# Patient Record
Sex: Female | Born: 1965 | Race: White | Hispanic: No | Marital: Married
Health system: Southern US, Community
[De-identification: ages and names within clinical notes are randomized; demographics above are authoritative.]

---

## 2004-02-12 ENCOUNTER — Other Ambulatory Visit: Admission: RE | Admit: 2004-02-12 | Discharge: 2004-02-12 | Payer: Self-pay | Admitting: Family Medicine

## 2004-05-22 ENCOUNTER — Other Ambulatory Visit: Admission: RE | Admit: 2004-05-22 | Discharge: 2004-05-22 | Payer: Self-pay | Admitting: Family Medicine

## 2005-12-30 ENCOUNTER — Other Ambulatory Visit: Admission: RE | Admit: 2005-12-30 | Discharge: 2005-12-30 | Payer: Self-pay | Admitting: Family Medicine

## 2006-01-08 ENCOUNTER — Encounter: Admission: RE | Admit: 2006-01-08 | Discharge: 2006-01-08 | Payer: Self-pay | Admitting: Family Medicine

## 2006-08-24 ENCOUNTER — Encounter: Admission: RE | Admit: 2006-08-24 | Discharge: 2006-08-24 | Payer: Self-pay | Admitting: Family Medicine

## 2007-03-15 ENCOUNTER — Encounter: Admission: RE | Admit: 2007-03-15 | Discharge: 2007-03-15 | Payer: Self-pay | Admitting: Family Medicine

## 2007-05-05 ENCOUNTER — Other Ambulatory Visit: Admission: RE | Admit: 2007-05-05 | Discharge: 2007-05-05 | Payer: Self-pay | Admitting: Family Medicine

## 2011-04-08 ENCOUNTER — Other Ambulatory Visit (HOSPITAL_COMMUNITY)
Admission: RE | Admit: 2011-04-08 | Discharge: 2011-04-08 | Disposition: A | Discharge status: Home / Self Care | Payer: BC Managed Care – PPO | Source: Ambulatory Visit | Attending: Family Medicine | Admitting: Family Medicine

## 2011-04-08 ENCOUNTER — Other Ambulatory Visit: Payer: Self-pay | Admitting: Family Medicine

## 2011-04-08 DIAGNOSIS — Z1159 Encounter for screening for other viral diseases: Secondary | ICD-10-CM | POA: Insufficient documentation

## 2011-04-08 DIAGNOSIS — Z124 Encounter for screening for malignant neoplasm of cervix: Secondary | ICD-10-CM | POA: Insufficient documentation

## 2012-06-14 ENCOUNTER — Other Ambulatory Visit: Payer: Self-pay | Admitting: Family Medicine

## 2012-06-14 DIAGNOSIS — Z1231 Encounter for screening mammogram for malignant neoplasm of breast: Secondary | ICD-10-CM

## 2012-07-08 ENCOUNTER — Ambulatory Visit
Admission: RE | Admit: 2012-07-08 | Discharge: 2012-07-08 | Disposition: A | Discharge status: Home / Self Care | Payer: BC Managed Care – PPO | Source: Ambulatory Visit | Attending: Family Medicine | Admitting: Family Medicine

## 2012-07-08 DIAGNOSIS — Z1231 Encounter for screening mammogram for malignant neoplasm of breast: Secondary | ICD-10-CM

## 2013-06-20 ENCOUNTER — Other Ambulatory Visit: Payer: Self-pay

## 2013-06-20 DIAGNOSIS — Z1231 Encounter for screening mammogram for malignant neoplasm of breast: Secondary | ICD-10-CM

## 2013-07-10 ENCOUNTER — Ambulatory Visit
Admission: RE | Admit: 2013-07-10 | Discharge: 2013-07-10 | Disposition: A | Discharge status: Home / Self Care | Payer: BC Managed Care – PPO | Source: Ambulatory Visit

## 2013-07-10 DIAGNOSIS — Z1231 Encounter for screening mammogram for malignant neoplasm of breast: Secondary | ICD-10-CM

## 2013-07-13 ENCOUNTER — Other Ambulatory Visit: Payer: Self-pay | Admitting: Family Medicine

## 2013-07-13 DIAGNOSIS — R928 Other abnormal and inconclusive findings on diagnostic imaging of breast: Secondary | ICD-10-CM

## 2013-07-31 ENCOUNTER — Ambulatory Visit
Admission: RE | Admit: 2013-07-31 | Discharge: 2013-07-31 | Disposition: A | Discharge status: Home / Self Care | Payer: BC Managed Care – PPO | Source: Ambulatory Visit | Attending: Family Medicine | Admitting: Family Medicine

## 2013-07-31 DIAGNOSIS — R928 Other abnormal and inconclusive findings on diagnostic imaging of breast: Secondary | ICD-10-CM

## 2014-01-02 ENCOUNTER — Other Ambulatory Visit: Payer: Self-pay | Admitting: Family Medicine

## 2014-01-02 DIAGNOSIS — R921 Mammographic calcification found on diagnostic imaging of breast: Secondary | ICD-10-CM

## 2014-01-31 ENCOUNTER — Ambulatory Visit
Admission: RE | Admit: 2014-01-31 | Discharge: 2014-01-31 | Disposition: A | Discharge status: Home / Self Care | Payer: BC Managed Care – PPO | Source: Ambulatory Visit | Attending: Family Medicine | Admitting: Family Medicine

## 2014-01-31 ENCOUNTER — Encounter (INDEPENDENT_AMBULATORY_CARE_PROVIDER_SITE_OTHER): Payer: Self-pay

## 2014-01-31 DIAGNOSIS — R921 Mammographic calcification found on diagnostic imaging of breast: Secondary | ICD-10-CM

## 2014-07-31 ENCOUNTER — Other Ambulatory Visit: Payer: Self-pay | Admitting: Family Medicine

## 2014-07-31 DIAGNOSIS — R921 Mammographic calcification found on diagnostic imaging of breast: Secondary | ICD-10-CM

## 2014-08-15 ENCOUNTER — Ambulatory Visit
Admission: RE | Admit: 2014-08-15 | Discharge: 2014-08-15 | Disposition: A | Discharge status: Home / Self Care | Payer: BC Managed Care – PPO | Source: Ambulatory Visit | Attending: Family Medicine | Admitting: Family Medicine

## 2014-08-15 DIAGNOSIS — R921 Mammographic calcification found on diagnostic imaging of breast: Secondary | ICD-10-CM

## 2014-10-09 ENCOUNTER — Other Ambulatory Visit (HOSPITAL_COMMUNITY)
Admission: RE | Admit: 2014-10-09 | Discharge: 2014-10-09 | Disposition: A | Discharge status: Home / Self Care | Payer: BC Managed Care – PPO | Source: Ambulatory Visit | Attending: Family Medicine | Admitting: Family Medicine

## 2014-10-09 ENCOUNTER — Other Ambulatory Visit: Payer: Self-pay | Admitting: Family Medicine

## 2014-10-09 DIAGNOSIS — Z124 Encounter for screening for malignant neoplasm of cervix: Secondary | ICD-10-CM | POA: Insufficient documentation

## 2014-10-09 DIAGNOSIS — Z1151 Encounter for screening for human papillomavirus (HPV): Secondary | ICD-10-CM | POA: Insufficient documentation

## 2014-10-12 LAB — CYTOLOGY - PAP

## 2014-10-24 ENCOUNTER — Other Ambulatory Visit: Payer: Self-pay | Admitting: Obstetrics & Gynecology

## 2015-08-27 ENCOUNTER — Other Ambulatory Visit: Payer: Self-pay | Admitting: Family Medicine

## 2015-08-27 DIAGNOSIS — R921 Mammographic calcification found on diagnostic imaging of breast: Secondary | ICD-10-CM

## 2015-09-12 ENCOUNTER — Ambulatory Visit
Admission: RE | Admit: 2015-09-12 | Discharge: 2015-09-12 | Disposition: A | Discharge status: Home / Self Care | Payer: BC Managed Care – PPO | Source: Ambulatory Visit | Attending: Family Medicine | Admitting: Family Medicine

## 2015-09-12 DIAGNOSIS — R921 Mammographic calcification found on diagnostic imaging of breast: Secondary | ICD-10-CM

## 2016-10-29 ENCOUNTER — Other Ambulatory Visit (HOSPITAL_COMMUNITY)
Admission: RE | Admit: 2016-10-29 | Discharge: 2016-10-29 | Disposition: A | Discharge status: Home / Self Care | Payer: BC Managed Care – PPO | Source: Ambulatory Visit | Attending: Obstetrics and Gynecology | Admitting: Obstetrics and Gynecology

## 2016-10-29 ENCOUNTER — Other Ambulatory Visit: Payer: Self-pay | Admitting: Obstetrics & Gynecology

## 2016-10-29 DIAGNOSIS — Z01419 Encounter for gynecological examination (general) (routine) without abnormal findings: Secondary | ICD-10-CM | POA: Diagnosis not present

## 2016-11-02 LAB — CYTOLOGY - PAP
ADEQUACY: ABSENT
Diagnosis: NEGATIVE

## 2017-12-30 ENCOUNTER — Other Ambulatory Visit: Payer: Self-pay | Admitting: Obstetrics & Gynecology

## 2017-12-30 DIAGNOSIS — Z1231 Encounter for screening mammogram for malignant neoplasm of breast: Secondary | ICD-10-CM

## 2018-01-19 ENCOUNTER — Ambulatory Visit
Admission: RE | Admit: 2018-01-19 | Discharge: 2018-01-19 | Disposition: A | Discharge status: Home / Self Care | Payer: BC Managed Care – PPO | Source: Ambulatory Visit | Attending: Obstetrics & Gynecology | Admitting: Obstetrics & Gynecology

## 2018-01-19 DIAGNOSIS — Z1231 Encounter for screening mammogram for malignant neoplasm of breast: Secondary | ICD-10-CM

## 2018-09-30 ENCOUNTER — Other Ambulatory Visit: Payer: Self-pay | Admitting: Otolaryngology

## 2018-09-30 DIAGNOSIS — H9190 Unspecified hearing loss, unspecified ear: Secondary | ICD-10-CM

## 2018-10-08 ENCOUNTER — Ambulatory Visit
Admission: RE | Admit: 2018-10-08 | Discharge: 2018-10-08 | Disposition: A | Discharge status: Home / Self Care | Payer: BC Managed Care – PPO | Source: Ambulatory Visit | Attending: Otolaryngology | Admitting: Otolaryngology

## 2018-10-08 DIAGNOSIS — H9190 Unspecified hearing loss, unspecified ear: Secondary | ICD-10-CM

## 2018-10-08 MED ORDER — GADOBENATE DIMEGLUMINE 529 MG/ML IV SOLN
20.0000 mL | Freq: Once | INTRAVENOUS | Status: AC | PRN
  Administered 2018-10-08: 20 mL via INTRAVENOUS

## 2019-03-25 ENCOUNTER — Other Ambulatory Visit: Payer: Self-pay | Admitting: Otolaryngology

## 2019-03-25 DIAGNOSIS — E041 Nontoxic single thyroid nodule: Secondary | ICD-10-CM

## 2019-03-31 ENCOUNTER — Ambulatory Visit
Admission: RE | Admit: 2019-03-31 | Discharge: 2019-03-31 | Disposition: A | Discharge status: Home / Self Care | Payer: BC Managed Care – PPO | Source: Ambulatory Visit | Attending: Otolaryngology | Admitting: Otolaryngology

## 2019-03-31 ENCOUNTER — Other Ambulatory Visit: Payer: Self-pay

## 2019-03-31 DIAGNOSIS — E041 Nontoxic single thyroid nodule: Secondary | ICD-10-CM

## 2019-12-06 IMAGING — US US THYROID
1 series · 14 of 25 positions shown · non-contrast
Comparison: None.

CLINICAL DATA: Abnormal thyroid function test

EXAM:
THYROID ULTRASOUND
TECHNIQUE: Ultrasound examination of the thyroid gland and adjacent soft
tissues was performed.

[Series 1: us thyroid · 0.06mm/px · 44 acquisitions, 14 frames shown]
[im 1/44]
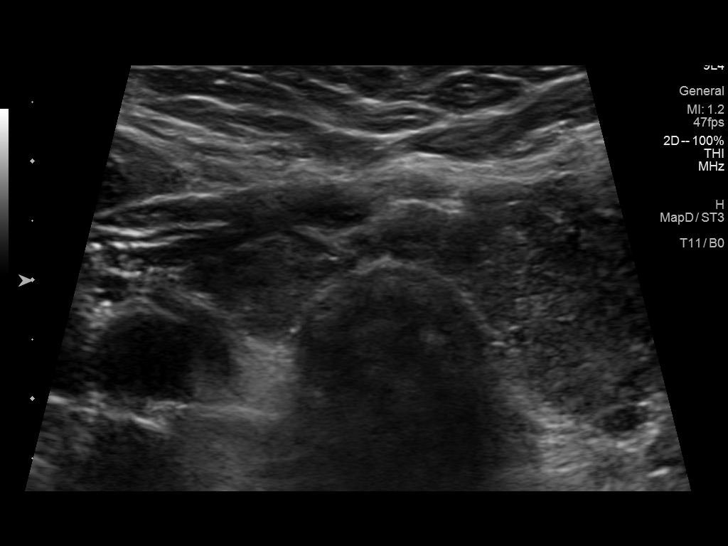
[im 4/44]
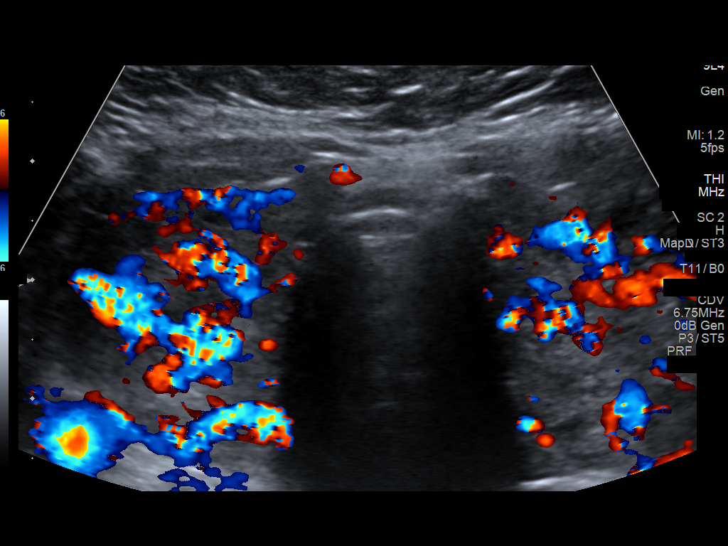
[im 8/44]
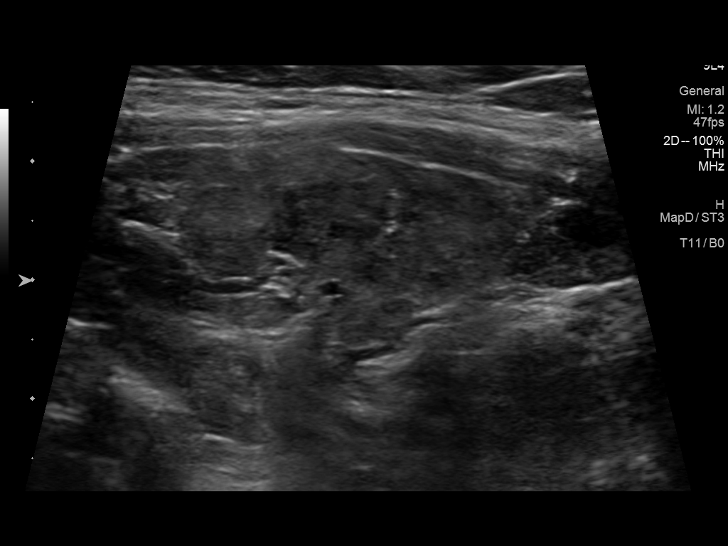
[im 11/44]
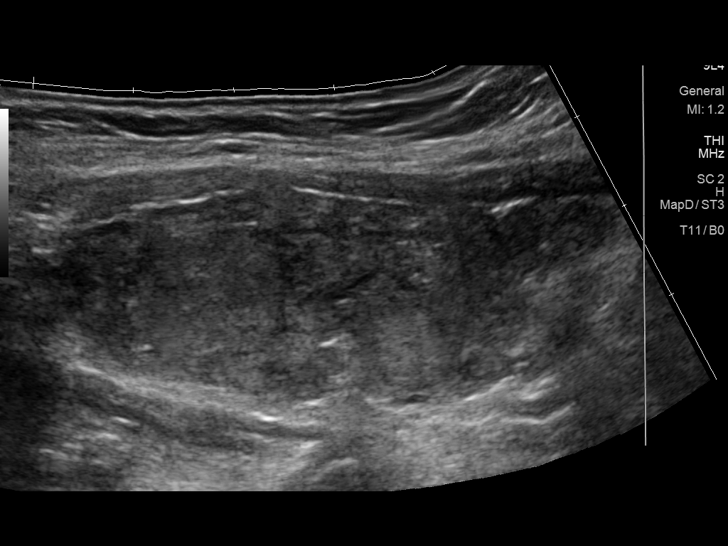
[im 15/44]
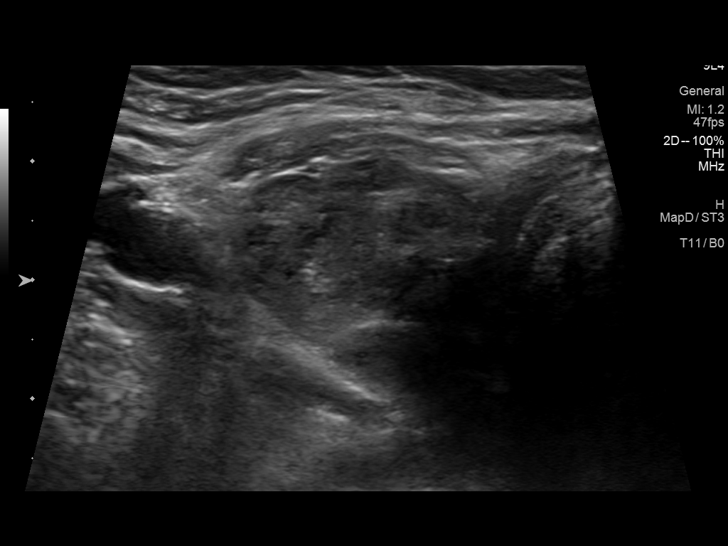
[im 17/44]
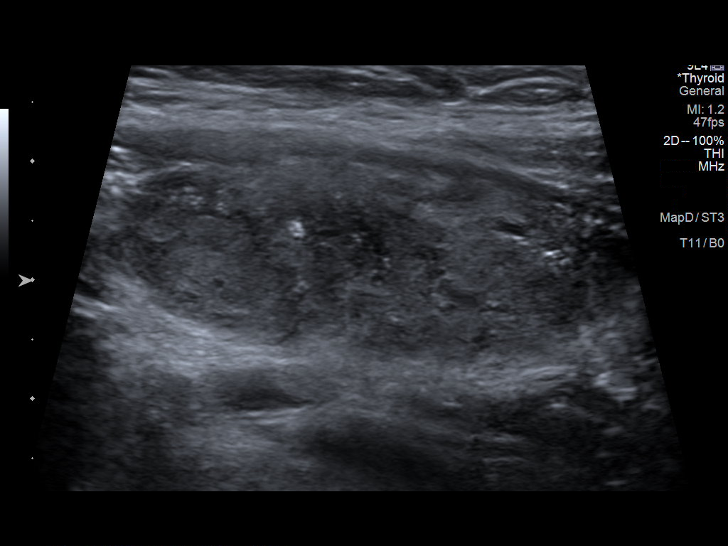
[im 20/44]
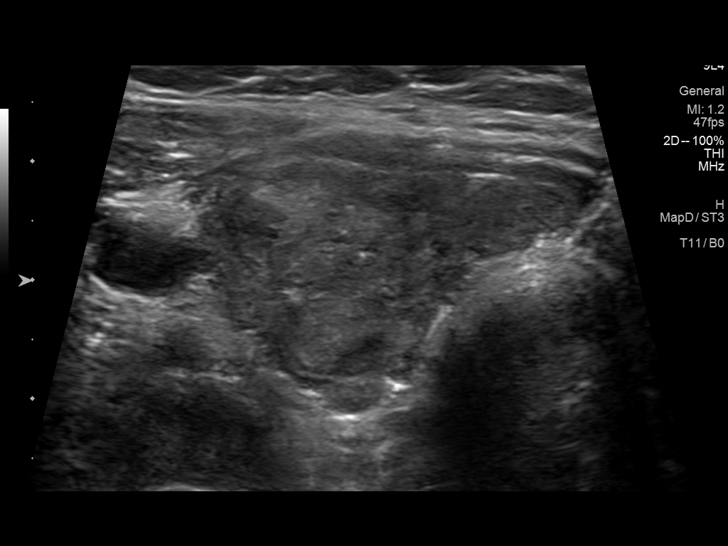
[im 24/44]
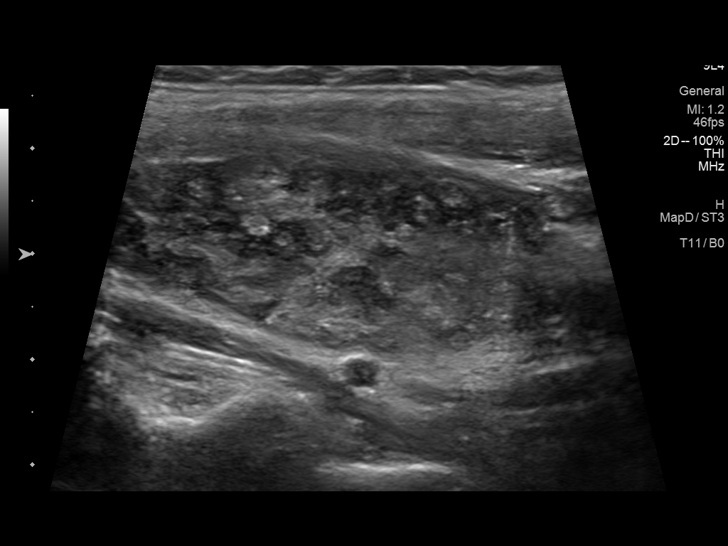
[im 27/44]
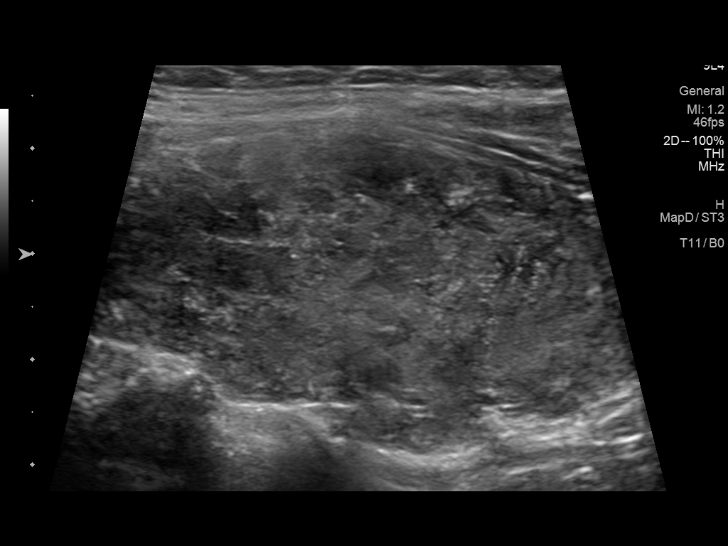
[im 29/44]
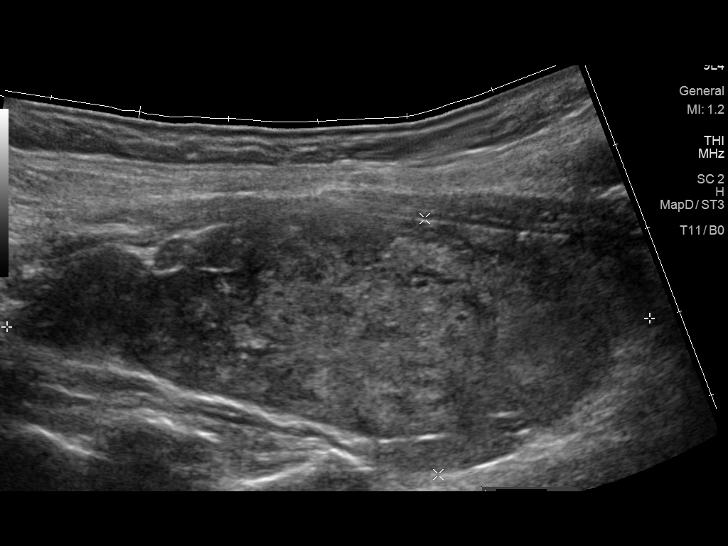
[im 33/44]
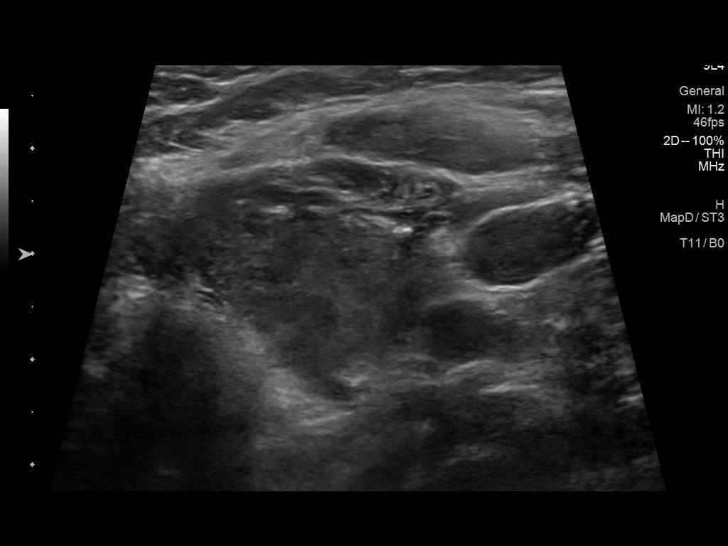
[im 36/44]
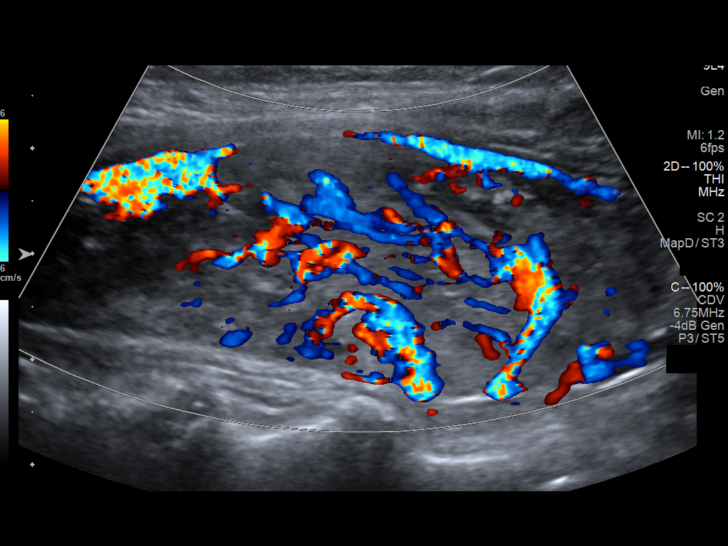
[im 40/44]
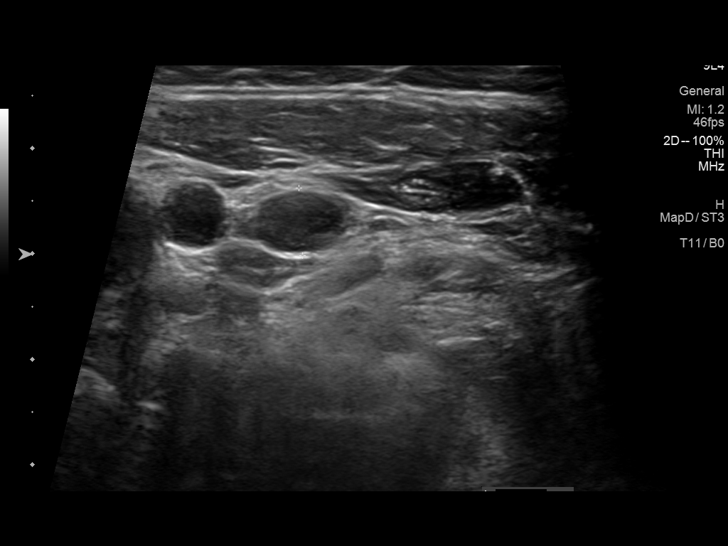
[im 44/44]
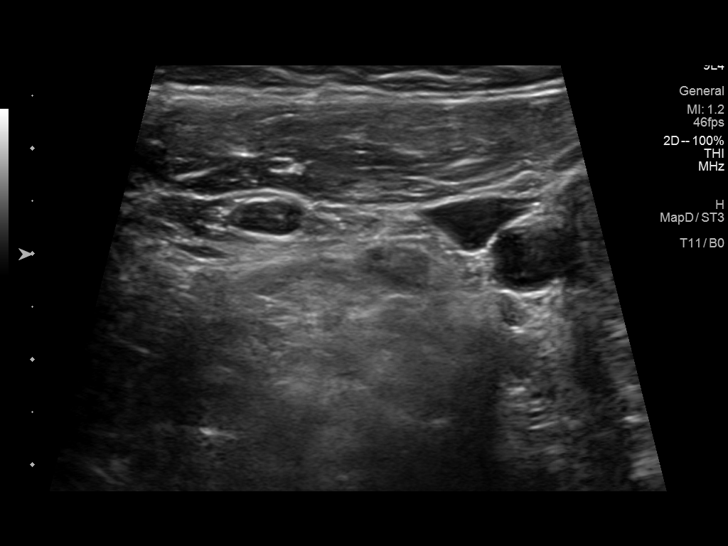

[14 of 25 positions shown; findings below may reference images not displayed]

FINDINGS: Parenchymal Echotexture: Markedly heterogenous

Isthmus: 6 mm

Right lobe: 5.7 x 2.2 x 2.7 cm

Left lobe: 7.2 x 2.9 x 3.4 cm

_________________________________________________________

Estimated total number of nodules >/= 1 cm: 0

Number of spongiform nodules >/=  2 cm not described below (TR1): 0

Number of mixed cystic and solid nodules >/= 1.5 cm not described
below (TR2): 0

_________________________________________________________

Markedly heterogeneous thyroid gland with background pseudo
nodularity and lobulation. Gland is also hypervascular more so on
the right. No discrete measurable nodules. No adenopathy.
IMPRESSION: Markedly heterogeneous hypervascular thyroid, nonspecific but can be
seen with thyroiditis or Graves disease.

The above is in keeping with the ACR TI-RADS recommendations - [HOSPITAL] 4995;[DATE].

## 2020-06-25 ENCOUNTER — Other Ambulatory Visit: Payer: Self-pay | Admitting: Obstetrics & Gynecology

## 2020-06-25 ENCOUNTER — Other Ambulatory Visit: Payer: Self-pay | Admitting: Obstetrics and Gynecology

## 2020-06-25 DIAGNOSIS — Z1231 Encounter for screening mammogram for malignant neoplasm of breast: Secondary | ICD-10-CM

## 2020-07-18 ENCOUNTER — Other Ambulatory Visit: Payer: Self-pay

## 2020-07-18 ENCOUNTER — Ambulatory Visit: Payer: BC Managed Care – PPO | Admitting: Podiatry

## 2020-07-18 ENCOUNTER — Ambulatory Visit (INDEPENDENT_AMBULATORY_CARE_PROVIDER_SITE_OTHER): Payer: BC Managed Care – PPO

## 2020-07-18 DIAGNOSIS — M7661 Achilles tendinitis, right leg: Secondary | ICD-10-CM

## 2020-07-18 DIAGNOSIS — M216X9 Other acquired deformities of unspecified foot: Secondary | ICD-10-CM

## 2020-07-18 DIAGNOSIS — M7731 Calcaneal spur, right foot: Secondary | ICD-10-CM

## 2020-07-18 DIAGNOSIS — M216X2 Other acquired deformities of left foot: Secondary | ICD-10-CM

## 2020-07-18 DIAGNOSIS — S9031XA Contusion of right foot, initial encounter: Secondary | ICD-10-CM

## 2020-07-18 MED ORDER — DICLOFENAC SODIUM 1 % EX GEL: 2.0000 g | Freq: Four times a day (QID) | CUTANEOUS | 2 refills | Status: AC

## 2020-07-18 MED ORDER — MELOXICAM 15 MG PO TABS: 15.0000 mg | ORAL_TABLET | Freq: Every day | ORAL | 0 refills | Status: AC | PRN

## 2020-07-18 NOTE — Patient Instructions (Addendum)
If was nice to meet you today. If you have any questions or any further concerns, please feel fee to give me a call. You can call our office at [phone] or please feel fee to send me a message through MyChart.    Achilles Tendinitis  with Rehab Achilles tendinitis is a disorder of the Achilles tendon. The Achilles tendon connects the large calf muscles (Gastrocnemius and Soleus) to the heel bone (calcaneus). This tendon is sometimes called the heel cord. It is important for pushing-off and standing on your toes and is important for walking, running, or jumping. Tendinitis is often caused by overuse and repetitive microtrauma. SYMPTOMS  Pain, tenderness, swelling, warmth, and redness may occur over the Achilles tendon even at rest.  Pain with pushing off, or flexing or extending the ankle.  Pain that is worsened after or during activity. CAUSES   Overuse sometimes seen with rapid increase in exercise programs or in sports requiring running and jumping.  Poor physical conditioning (strength and flexibility or endurance).  Running sports, especially training running down hills.  Inadequate warm-up before practice or play or failure to stretch before participation.  Injury to the tendon. PREVENTION   Warm up and stretch before practice or competition.  Allow time for adequate rest and recovery between practices and competition.  Keep up conditioning.  Keep up ankle and leg flexibility.  Improve or keep muscle strength and endurance.  Improve cardiovascular fitness.  Use proper technique.  Use proper equipment (shoes, skates).  To help prevent recurrence, taping, protective strapping, or an adhesive bandage may be recommended for several weeks after healing is complete. PROGNOSIS   Recovery may take weeks to several months to heal.  Longer recovery is expected if symptoms have been prolonged.  Recovery is usually quicker if the inflammation is due to a direct blow as  compared with overuse or sudden strain. RELATED COMPLICATIONS   Healing time will be prolonged if the condition is not correctly treated. The injury must be given plenty of time to heal.  Symptoms can reoccur if activity is resumed too soon.  Untreated, tendinitis may increase the risk of tendon rupture requiring additional time for recovery and possibly surgery. TREATMENT   The first treatment consists of rest anti-inflammatory medication, and ice to relieve the pain.  Stretching and strengthening exercises after resolution of pain will likely help reduce the risk of recurrence. Referral to a physical therapist or athletic trainer for further evaluation and treatment may be helpful.  A walking boot or cast may be recommended to rest the Achilles tendon. This can help break the cycle of inflammation and microtrauma.  Arch supports (orthotics) may be prescribed or recommended by your caregiver as an adjunct to therapy and rest.  Surgery to remove the inflamed tendon lining or degenerated tendon tissue is rarely necessary and has shown less than predictable results. MEDICATION   Nonsteroidal anti-inflammatory medications, such as aspirin and ibuprofen, may be used for pain and inflammation relief. Do not take within 7 days before surgery. Take these as directed by your caregiver. Contact your caregiver immediately if any bleeding, stomach upset, or signs of allergic reaction occur. Other minor pain relievers, such as acetaminophen, may also be used.  Pain relievers may be prescribed as necessary by your caregiver. Do not take prescription pain medication for longer than 4 to 7 days. Use only as directed and only as much as you need.  Cortisone injections are rarely indicated. Cortisone injections may weaken tendons and predispose  to rupture. It is better to give the condition more time to heal than to use them. HEAT AND COLD  Cold is used to relieve pain and reduce inflammation for acute  and chronic Achilles tendinitis. Cold should be applied for 10 to 15 minutes every 2 to 3 hours for inflammation and pain and immediately after any activity that aggravates your symptoms. Use ice packs or an ice massage.  Heat may be used before performing stretching and strengthening activities prescribed by your caregiver. Use a heat pack or a warm soak. SEEK MEDICAL CARE IF:  Symptoms get worse or do not improve in 2 weeks despite treatment.  New, unexplained symptoms develop. Drugs used in treatment may produce side effects.  EXERCISES:  RANGE OF MOTION (ROM) AND STRETCHING EXERCISES - Achilles Tendinitis  These exercises may help you when beginning to rehabilitate your injury. Your symptoms may resolve with or without further involvement from your physician, physical therapist or athletic trainer. While completing these exercises, remember:   Restoring tissue flexibility helps normal motion to return to the joints. This allows healthier, less painful movement and activity.  An effective stretch should be held for at least 30 seconds.  A stretch should never be painful. You should only feel a gentle lengthening or release in the stretched tissue.  STRETCH  Gastroc, Standing   Place hands on wall.  Extend right / left leg, keeping the front knee somewhat bent.  Slightly point your toes inward on your back foot.  Keeping your right / left heel on the floor and your knee straight, shift your weight toward the wall, not allowing your back to arch.  You should feel a gentle stretch in the right / left calf. Hold this position for 10 seconds. Repeat 3 times. Complete this stretch 2 times per day.  STRETCH  Soleus, Standing   Place hands on wall.  Extend right / left leg, keeping the other knee somewhat bent.  Slightly point your toes inward on your back foot.  Keep your right / left heel on the floor, bend your back knee, and slightly shift your weight over the back leg so that  you feel a gentle stretch deep in your back calf.  Hold this position for 10 seconds. Repeat 3 times. Complete this stretch 2 times per day.  STRETCH  Gastrocsoleus, Standing  Note: This exercise can place a lot of stress on your foot and ankle. Please complete this exercise only if specifically instructed by your caregiver.   Place the ball of your right / left foot on a step, keeping your other foot firmly on the same step.  Hold on to the wall or a rail for balance.  Slowly lift your other foot, allowing your body weight to press your heel down over the edge of the step.  You should feel a stretch in your right / left calf.  Hold this position for 10 seconds.  Repeat this exercise with a slight bend in your knee. Repeat 3 times. Complete this stretch 2 times per day.   STRENGTHENING EXERCISES - Achilles Tendinitis These exercises may help you when beginning to rehabilitate your injury. They may resolve your symptoms with or without further involvement from your physician, physical therapist or athletic trainer. While completing these exercises, remember:   Muscles can gain both the endurance and the strength needed for everyday activities through controlled exercises.  Complete these exercises as instructed by your physician, physical therapist or athletic trainer. Progress the resistance  and repetitions only as guided.  You may experience muscle soreness or fatigue, but the pain or discomfort you are trying to eliminate should never worsen during these exercises. If this pain does worsen, stop and make certain you are following the directions exactly. If the pain is still present after adjustments, discontinue the exercise until you can discuss the trouble with your clinician.  STRENGTH - Plantar-flexors   Sit with your right / left leg extended. Holding onto both ends of a rubber exercise band/tubing, loop it around the ball of your foot. Keep a slight tension in the  band.  Slowly push your toes away from you, pointing them downward.  Hold this position for 10 seconds. Return slowly, controlling the tension in the band/tubing. Repeat 3 times. Complete this exercise 2 times per day.   STRENGTH - Plantar-flexors   Stand with your feet shoulder width apart. Steady yourself with a wall or table using as little support as needed.  Keeping your weight evenly spread over the width of your feet, rise up on your toes.*  Hold this position for 10 seconds. Repeat 3 times. Complete this exercise 2 times per day.  *If this is too easy, shift your weight toward your right / left leg until you feel challenged. Ultimately, you may be asked to do this exercise with your right / left foot only.  STRENGTH  Plantar-flexors, Eccentric  Note: This exercise can place a lot of stress on your foot and ankle. Please complete this exercise only if specifically instructed by your caregiver.   Place the balls of your feet on a step. With your hands, use only enough support from a wall or rail to keep your balance.  Keep your knees straight and rise up on your toes.  Slowly shift your weight entirely to your right / left toes and pick up your opposite foot. Gently and with controlled movement, lower your weight through your right / left foot so that your heel drops below the level of the step. You will feel a slight stretch in the back of your calf at the end position.  Use the healthy leg to help rise up onto the balls of both feet, then lower weight only on the right / left leg again. Build up to 15 repetitions. Then progress to 3 consecutive sets of 15 repetitions.*  After completing the above exercise, complete the same exercise with a slight knee bend (about 30 degrees). Again, build up to 15 repetitions. Then progress to 3 consecutive sets of 15 repetitions.* Perform this exercise 2 times per day.  *When you easily complete 3 sets of 15, your physician, physical therapist  or athletic trainer may advise you to add resistance by wearing a backpack filled with additional weight.  STRENGTH - Plantar Flexors, Seated   Sit on a chair that allows your feet to rest flat on the ground. If necessary, sit at the edge of the chair.  Keeping your toes firmly on the ground, lift your right / left heel as far as you can without increasing any discomfort in your ankle. Repeat 3 times. Complete this exercise 2 times a day.

## 2020-07-19 NOTE — Progress Notes (Signed)
Subjective:   Patient ID: Heidi Reynolds, female   DOB: 54 y.o.   MRN: [id number]   HPI 54 year old female presents the office today for concerns of right heel pain mostly to the back of the heel as well as the Achilles tendon.  She states that she had stiffness as well as burning to the area.  No rating pain or weakness.  She states that she has pain in the morning when she first gets up.  Also gets discomfort after she is on her feet all day.  No recent injury.  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  This does aggravate her symptoms as well.  No other concerns today.   Review of Systems  All other systems reviewed and are negative.  No past medical history on file.   Current Outpatient Medications:  .  diclofenac Sodium (VOLTAREN) 1 % GEL, Apply 2 g topically 4 (four) times daily. Rub into affected area of foot 2 to 4 times daily, Disp: 100 g, Rfl: 2 .  meloxicam (MOBIC) 15 MG tablet, Take 1 tablet (15 mg total) by mouth daily as needed for pain., Disp: 30 tablet, Rfl: 0  No Known Allergies      Objective:  Physical Exam  General: AAO x3, NAD  Dermatological: Skin is warm, dry and supple bilateral.  There are no open sores, no preulcerative lesions, no rash or signs of infection present.  Vascular: Dorsalis Pedis artery and Posterior Tibial artery pedal pulses are 2/4 bilateral with immedate capillary fill time.  There is no pain with calf compression, swelling, warmth, erythema.   Neruologic: Grossly intact via light touch bilateral.Negative tinel sign.   Musculoskeletal: There is mild discomfort on the distal portion of the Achilles tendon on the right side on the insertion into the calcaneus.  Discomfort the posterior aspect the calcaneus as well on the area of the bone spur.  There is no lateral compression of calcaneus or to the inferior calcaneus.  MMT 5/5. Cavus foot type.  But evaluation of the shoes that she is wearing she is  putting more pressure to lateral aspect, fifth metatarsal head that is worn out in the sandal. Thompson test is negative.   Gait: Unassisted, Nonantalgic.       Assessment:   54 year old female with posterior heel spur, Achilles tendonitis    Plan:  -Treatment options discussed including all alternatives, risks, and complications -Etiology of symptoms were discussed -X-rays were obtained and reviewed with the patient.  Posterior calcaneal spurring is present.  No other evidence of acute fracture identified today. -Night splint -Prescribed mobic. Discussed side effects of the medication and directed to stop if any are to occur and call the office.  -Continue Voltaren gel as well if not taking oral anti-inflammatories -Measure for orthotics today with Raiford Noble.  -Stretching, icing daily  Vivi Barrack DPM

## 2020-07-26 ENCOUNTER — Ambulatory Visit: Payer: BC Managed Care – PPO

## 2020-07-30 ENCOUNTER — Other Ambulatory Visit: Payer: Self-pay

## 2020-07-30 ENCOUNTER — Other Ambulatory Visit: Payer: Self-pay | Admitting: Obstetrics and Gynecology

## 2020-07-30 ENCOUNTER — Ambulatory Visit
Admission: RE | Admit: 2020-07-30 | Discharge: 2020-07-30 | Disposition: A | Discharge status: Home / Self Care | Payer: BC Managed Care – PPO | Source: Ambulatory Visit | Attending: Obstetrics and Gynecology | Admitting: Obstetrics and Gynecology

## 2020-07-30 DIAGNOSIS — Z1231 Encounter for screening mammogram for malignant neoplasm of breast: Secondary | ICD-10-CM

## 2020-07-31 ENCOUNTER — Telehealth: Payer: Self-pay

## 2020-07-31 NOTE — Telephone Encounter (Signed)
Received faxed request for prior authorization for diclofenac gel from [facility]. Called patient to let her know that PA is not necessary, that she can purchase medication over the counter. The phone rang three times then clicked to a fast busy signal. Unable to leave a message

## 2020-08-15 ENCOUNTER — Encounter: Payer: BC Managed Care – PPO | Admitting: Orthotics

## 2020-08-23 ENCOUNTER — Ambulatory Visit: Payer: BC Managed Care – PPO | Admitting: Orthotics

## 2020-08-23 ENCOUNTER — Other Ambulatory Visit: Payer: Self-pay

## 2020-08-23 DIAGNOSIS — M7731 Calcaneal spur, right foot: Secondary | ICD-10-CM

## 2020-08-23 DIAGNOSIS — M7661 Achilles tendinitis, right leg: Secondary | ICD-10-CM

## 2020-08-23 DIAGNOSIS — M216X9 Other acquired deformities of unspecified foot: Secondary | ICD-10-CM

## 2020-08-23 NOTE — Progress Notes (Signed)
Patient picked up f/o and was pleased with fit, comfort, and function.  Worked well with footwear.  Told of rbeak in period and how to report any issues.  

## 2022-09-24 ENCOUNTER — Other Ambulatory Visit: Payer: Self-pay | Admitting: Obstetrics and Gynecology

## 2022-09-24 DIAGNOSIS — Z1231 Encounter for screening mammogram for malignant neoplasm of breast: Secondary | ICD-10-CM

## 2022-09-25 ENCOUNTER — Ambulatory Visit
Admission: RE | Admit: 2022-09-25 | Discharge: 2022-09-25 | Disposition: A | Discharge status: Home / Self Care | Payer: BC Managed Care – PPO | Source: Ambulatory Visit | Attending: Obstetrics and Gynecology | Admitting: Obstetrics and Gynecology

## 2022-09-25 DIAGNOSIS — Z1231 Encounter for screening mammogram for malignant neoplasm of breast: Secondary | ICD-10-CM

## 2024-02-28 ENCOUNTER — Other Ambulatory Visit: Payer: Self-pay | Admitting: Family Medicine

## 2024-02-28 ENCOUNTER — Other Ambulatory Visit: Payer: Self-pay

## 2024-02-28 DIAGNOSIS — Z1231 Encounter for screening mammogram for malignant neoplasm of breast: Secondary | ICD-10-CM

## 2024-02-29 ENCOUNTER — Ambulatory Visit: Payer: Self-pay

## 2024-02-29 ENCOUNTER — Ambulatory Visit
Admission: RE | Admit: 2024-02-29 | Discharge: 2024-02-29 | Disposition: A | Discharge status: Home / Self Care | Payer: Self-pay | Source: Ambulatory Visit | Attending: Family Medicine | Admitting: Family Medicine

## 2024-02-29 DIAGNOSIS — Z1231 Encounter for screening mammogram for malignant neoplasm of breast: Secondary | ICD-10-CM
